# Patient Record
Sex: Male | Born: 1940 | Race: White | Hispanic: No
Health system: Southern US, Community
[De-identification: ages and names within clinical notes are randomized; demographics above are authoritative.]

---

## 2006-09-24 ENCOUNTER — Inpatient Hospital Stay: Payer: Self-pay | Admitting: Internal Medicine

## 2006-09-24 ENCOUNTER — Other Ambulatory Visit: Payer: Self-pay

## 2006-11-13 ENCOUNTER — Other Ambulatory Visit: Payer: Self-pay

## 2006-11-14 ENCOUNTER — Inpatient Hospital Stay: Payer: Self-pay | Admitting: Internal Medicine

## 2011-08-15 ENCOUNTER — Ambulatory Visit: Payer: Self-pay | Admitting: Oncology

## 2011-08-15 ENCOUNTER — Ambulatory Visit: Payer: Self-pay | Admitting: Internal Medicine

## 2011-09-11 ENCOUNTER — Inpatient Hospital Stay: Payer: Self-pay | Admitting: Internal Medicine

## 2011-09-11 LAB — CBC WITH DIFFERENTIAL/PLATELET
Basophil #: 0 10*3/uL (ref 0.0–0.1)
Basophil %: 0.4 %
Eosinophil #: 0 10*3/uL (ref 0.0–0.7)
HGB: 11.9 g/dL — ABNORMAL LOW (ref 13.0–18.0)
Lymphocyte %: 4.3 %
MCH: 30.7 pg (ref 26.0–34.0)
MCHC: 33.4 g/dL (ref 32.0–36.0)
MCV: 92 fL (ref 80–100)
Monocyte #: 0.5 10*3/uL (ref 0.0–0.7)
Monocyte %: 3.6 %
Neutrophil #: 12.3 10*3/uL — ABNORMAL HIGH (ref 1.4–6.5)
Platelet: 283 10*3/uL (ref 150–440)
RDW: 14.6 % — ABNORMAL HIGH (ref 11.5–14.5)
WBC: 13.4 10*3/uL — ABNORMAL HIGH (ref 3.8–10.6)

## 2011-09-11 LAB — COMPREHENSIVE METABOLIC PANEL
Albumin: 3.4 g/dL (ref 3.4–5.0)
Alkaline Phosphatase: 108 U/L (ref 50–136)
Anion Gap: 13 (ref 7–16)
BUN: 13 mg/dL (ref 7–18)
Calcium, Total: 8.8 mg/dL (ref 8.5–10.1)
Glucose: 107 mg/dL — ABNORMAL HIGH (ref 65–99)
Osmolality: 276 (ref 275–301)
SGOT(AST): 28 U/L (ref 15–37)
SGPT (ALT): 15 U/L
Total Protein: 7.4 g/dL (ref 6.4–8.2)

## 2011-09-11 LAB — CK TOTAL AND CKMB (NOT AT ARMC): CK-MB: 0.9 ng/mL (ref 0.5–3.6)

## 2011-09-12 LAB — IRON AND TIBC: Unbound Iron-Bind.Cap.: 102 ug/dL

## 2011-09-12 LAB — HEMOGLOBIN A1C: Hemoglobin A1C: 5.2 % (ref 4.2–6.3)

## 2011-09-12 LAB — URINALYSIS, COMPLETE
Bacteria: NONE SEEN
Bilirubin,UR: NEGATIVE
Blood: NEGATIVE
Glucose,UR: NEGATIVE mg/dL (ref 0–75)
Leukocyte Esterase: NEGATIVE
Nitrite: NEGATIVE
Ph: 5 (ref 4.5–8.0)
Specific Gravity: 1.058 (ref 1.003–1.030)
WBC UR: 2 /HPF (ref 0–5)

## 2011-09-12 LAB — CBC WITH DIFFERENTIAL/PLATELET
Basophil #: 0 10*3/uL (ref 0.0–0.1)
Basophil %: 0.3 %
Eosinophil #: 0 10*3/uL (ref 0.0–0.7)
HGB: 9.7 g/dL — ABNORMAL LOW (ref 13.0–18.0)
Lymphocyte %: 20 %
MCH: 30.6 pg (ref 26.0–34.0)
MCHC: 33.1 g/dL (ref 32.0–36.0)
MCV: 93 fL (ref 80–100)
Monocyte #: 0.5 10*3/uL (ref 0.0–0.7)
Neutrophil #: 5.7 10*3/uL (ref 1.4–6.5)
Neutrophil %: 73.6 %
Platelet: 197 10*3/uL (ref 150–440)
RDW: 14.6 % — ABNORMAL HIGH (ref 11.5–14.5)
WBC: 7.8 10*3/uL (ref 3.8–10.6)

## 2011-09-12 LAB — BASIC METABOLIC PANEL
Anion Gap: 12 (ref 7–16)
BUN: 14 mg/dL (ref 7–18)
Chloride: 98 mmol/L (ref 98–107)
Co2: 28 mmol/L (ref 21–32)
EGFR (African American): >60
Osmolality: 276 (ref 275–301)
Potassium: 3.8 mmol/L (ref 3.5–5.1)
Sodium: 138 mmol/L (ref 136–145)

## 2011-09-12 LAB — FERRITIN: Ferritin (ARMC): 309 ng/mL (ref 8–388)

## 2011-09-12 LAB — FOLATE: Folic Acid: 4.2 ng/mL (ref 3.1–100.0)

## 2011-09-13 LAB — CBC WITH DIFFERENTIAL/PLATELET
Basophil %: 0.2 %
Eosinophil #: 0.1 10*3/uL (ref 0.0–0.7)
Eosinophil %: 1.2 %
HCT: 27.6 % — ABNORMAL LOW (ref 40.0–52.0)
HGB: 9.3 g/dL — ABNORMAL LOW (ref 13.0–18.0)
Lymphocyte #: 1.5 10*3/uL (ref 1.0–3.6)
Lymphocyte %: 23 %
MCH: 31 pg (ref 26.0–34.0)
MCV: 93 fL (ref 80–100)
Monocyte #: 0.3 10*3/uL (ref 0.0–0.7)
Neutrophil #: 4.7 10*3/uL (ref 1.4–6.5)
Neutrophil %: 70.9 %
Platelet: 194 10*3/uL (ref 150–440)
RBC: 2.99 10*6/uL — ABNORMAL LOW (ref 4.40–5.90)
RDW: 14.7 % — ABNORMAL HIGH (ref 11.5–14.5)

## 2011-09-14 LAB — PROTIME-INR
INR: 1
Prothrombin Time: 13.5 secs (ref 11.5–14.7)

## 2011-09-14 LAB — MAGNESIUM: Magnesium: 1.6 mg/dL — ABNORMAL LOW

## 2011-09-14 LAB — APTT: Activated PTT: 35.8 secs (ref 23.6–35.9)

## 2011-09-14 LAB — PHOSPHORUS: Phosphorus: 2 mg/dL — ABNORMAL LOW (ref 2.5–4.9)

## 2011-09-15 ENCOUNTER — Ambulatory Visit: Payer: Self-pay | Admitting: Oncology

## 2011-09-15 ENCOUNTER — Ambulatory Visit: Payer: Self-pay | Admitting: Internal Medicine

## 2011-09-17 LAB — CBC WITH DIFFERENTIAL/PLATELET
Basophil %: 0.5 %
Eosinophil %: 2.6 %
HGB: 8.4 g/dL — ABNORMAL LOW (ref 13.0–18.0)
Lymphocyte #: 1.6 10*3/uL (ref 1.0–3.6)
Lymphocyte %: 26.8 %
MCH: 30.4 pg (ref 26.0–34.0)
Monocyte #: 0.3 10*3/uL (ref 0.0–0.7)
Monocyte %: 4.6 %
Neutrophil %: 65.5 %
Platelet: 174 10*3/uL (ref 150–440)
RBC: 2.78 10*6/uL — ABNORMAL LOW (ref 4.40–5.90)

## 2011-09-17 LAB — BASIC METABOLIC PANEL
Anion Gap: 9 (ref 7–16)
BUN: 5 mg/dL — ABNORMAL LOW (ref 7–18)
Chloride: 98 mmol/L (ref 98–107)
Co2: 33 mmol/L — ABNORMAL HIGH (ref 21–32)
Creatinine: 0.59 mg/dL — ABNORMAL LOW (ref 0.60–1.30)
EGFR (African American): >60
Potassium: 3.3 mmol/L — ABNORMAL LOW (ref 3.5–5.1)
Sodium: 140 mmol/L (ref 136–145)

## 2011-09-17 LAB — MAGNESIUM: Magnesium: 1.8 mg/dL

## 2011-09-17 LAB — PHOSPHORUS: Phosphorus: 2 mg/dL — ABNORMAL LOW (ref 2.5–4.9)

## 2011-09-18 LAB — BASIC METABOLIC PANEL
Chloride: 98 mmol/L (ref 98–107)
Co2: 34 mmol/L — ABNORMAL HIGH (ref 21–32)
Creatinine: 0.61 mg/dL (ref 0.60–1.30)
EGFR (Non-African Amer.): >60
Glucose: 110 mg/dL — ABNORMAL HIGH (ref 65–99)
Sodium: 135 mmol/L — ABNORMAL LOW (ref 136–145)

## 2011-09-20 LAB — PATHOLOGY REPORT

## 2011-09-29 LAB — COMPREHENSIVE METABOLIC PANEL
Alkaline Phosphatase: 120 U/L (ref 50–136)
Anion Gap: 9 (ref 7–16)
Calcium, Total: 8.2 mg/dL — ABNORMAL LOW (ref 8.5–10.1)
Co2: 33 mmol/L — ABNORMAL HIGH (ref 21–32)
EGFR (African American): >60
EGFR (Non-African Amer.): >60
Osmolality: 272 (ref 275–301)
Sodium: 134 mmol/L — ABNORMAL LOW (ref 136–145)
Total Protein: 7.2 g/dL (ref 6.4–8.2)

## 2011-09-29 LAB — CBC
HCT: 26.6 % — ABNORMAL LOW (ref 40.0–52.0)
HGB: 8.8 g/dL — ABNORMAL LOW (ref 13.0–18.0)
MCHC: 33.1 g/dL (ref 32.0–36.0)
MCV: 96 fL (ref 80–100)
Platelet: 394 10*3/uL (ref 150–440)
RBC: 2.77 10*6/uL — ABNORMAL LOW (ref 4.40–5.90)
RDW: 18.2 % — ABNORMAL HIGH (ref 11.5–14.5)
WBC: 8.9 10*3/uL (ref 3.8–10.6)

## 2011-09-29 LAB — PROTIME-INR: Prothrombin Time: 15 secs — ABNORMAL HIGH (ref 11.5–14.7)

## 2011-09-30 ENCOUNTER — Inpatient Hospital Stay: Payer: Self-pay | Admitting: Internal Medicine

## 2011-09-30 LAB — DIFFERENTIAL
Basophil %: 0.1 %
Eosinophil %: 0.4 %
Lymphocyte #: 1.2 10*3/uL (ref 1.0–3.6)
Monocyte #: 0.7 10*3/uL (ref 0.0–0.7)
Monocyte %: 8.1 %
Neutrophil %: 78.2 %

## 2011-09-30 LAB — CBC WITH DIFFERENTIAL/PLATELET
Basophil %: 0.3 %
Eosinophil %: 0.7 %
HGB: 8.7 g/dL — ABNORMAL LOW (ref 13.0–18.0)
Lymphocyte #: 0.7 10*3/uL — ABNORMAL LOW (ref 1.0–3.6)
MCH: 31.9 pg (ref 26.0–34.0)
MCV: 96 fL (ref 80–100)
Monocyte #: 0.6 10*3/uL (ref 0.0–0.7)
Monocyte %: 8.4 %
Neutrophil #: 5.5 10*3/uL (ref 1.4–6.5)
Neutrophil %: 80.7 %
RBC: 2.74 10*6/uL — ABNORMAL LOW (ref 4.40–5.90)
WBC: 6.9 10*3/uL (ref 3.8–10.6)

## 2011-10-01 LAB — CBC WITH DIFFERENTIAL/PLATELET
Eosinophil %: 2.3 %
Lymphocyte #: 0.7 10*3/uL — ABNORMAL LOW (ref 1.0–3.6)
MCH: 31.2 pg (ref 26.0–34.0)
MCV: 97 fL (ref 80–100)
Monocyte #: 0.5 10*3/uL (ref 0.0–0.7)
Platelet: 321 10*3/uL (ref 150–440)
WBC: 4.2 10*3/uL (ref 3.8–10.6)

## 2011-10-02 LAB — BASIC METABOLIC PANEL
Anion Gap: 7 (ref 7–16)
Calcium, Total: 8.4 mg/dL — ABNORMAL LOW (ref 8.5–10.1)
Creatinine: 0.59 mg/dL — ABNORMAL LOW (ref 0.60–1.30)
EGFR (African American): >60
EGFR (Non-African Amer.): >60
Glucose: 64 mg/dL — ABNORMAL LOW (ref 65–99)
Osmolality: 269 (ref 275–301)

## 2011-10-02 LAB — CBC WITH DIFFERENTIAL/PLATELET
Basophil #: 0 10*3/uL (ref 0.0–0.1)
Basophil %: 0.6 %
Eosinophil #: 0.1 10*3/uL (ref 0.0–0.7)
HGB: 11.7 g/dL — ABNORMAL LOW (ref 13.0–18.0)
Lymphocyte #: 1.1 10*3/uL (ref 1.0–3.6)
Lymphocyte %: 24.2 %
MCH: 30.2 pg (ref 26.0–34.0)
MCHC: 32.8 g/dL (ref 32.0–36.0)
MCV: 92 fL (ref 80–100)
Monocyte #: 0.6 10*3/uL (ref 0.0–0.7)
Neutrophil %: 60.8 %
Platelet: 342 10*3/uL (ref 150–440)

## 2011-10-02 LAB — MAGNESIUM: Magnesium: 1.9 mg/dL

## 2011-10-13 ENCOUNTER — Ambulatory Visit: Payer: Self-pay | Admitting: Oncology

## 2011-10-13 ENCOUNTER — Ambulatory Visit: Payer: Self-pay | Admitting: Internal Medicine

## 2011-10-13 DEATH — deceased

## 2013-08-24 IMAGING — CT CT ABD-PELV W/ CM
1 of 2 series · 16 of 32 positions shown, 20 images · non-contrast
Comparison: none

REASON FOR EXAM: (1) fell hit r side c.o pain upper abd; (2) fell pain
COMMENTS:

PROCEDURE:     CT  - CT ABDOMEN / PELVIS  W  - September 11, 2011  [DATE]
RESULT:     History: Fall.

[Series 2: 3mm soft tissue · axial · 0.59mm/px · z∈[-364,+24]mm · 16 of 141 slices shown, 20 images]
[im 6/141  soft-tissue]
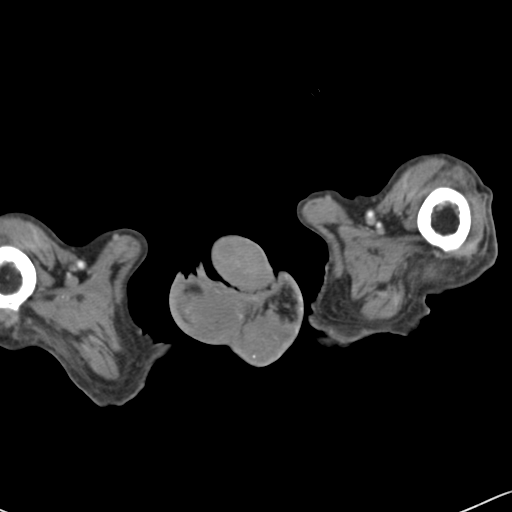
[im 6/141  bone]
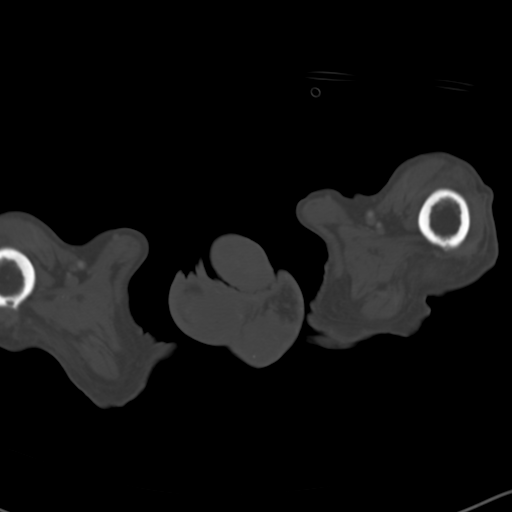
[im 18/141  soft-tissue]
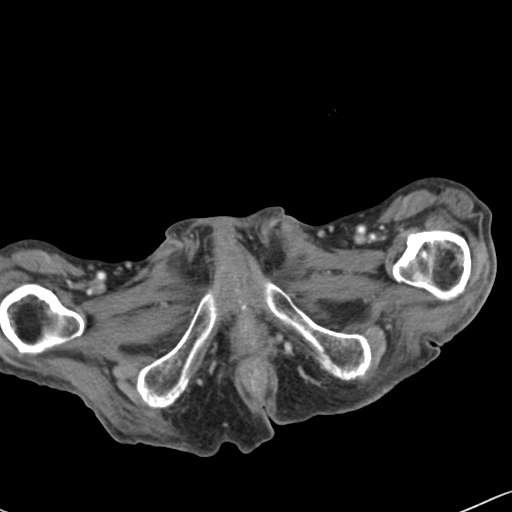
[im 30/141  soft-tissue]
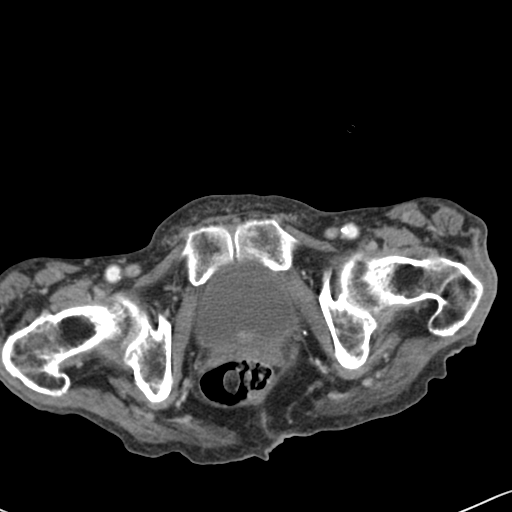
[im 36/141  soft-tissue]
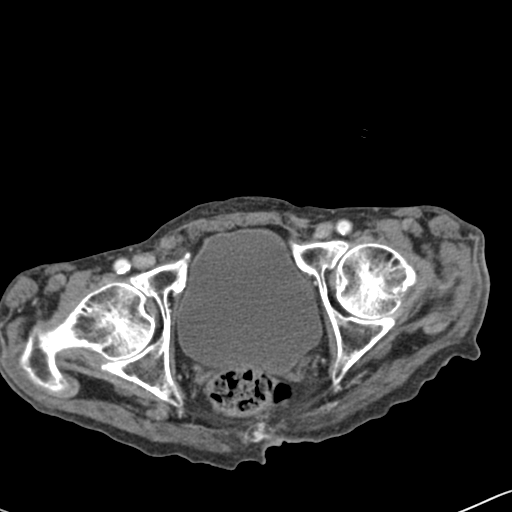
[im 47/141  soft-tissue]
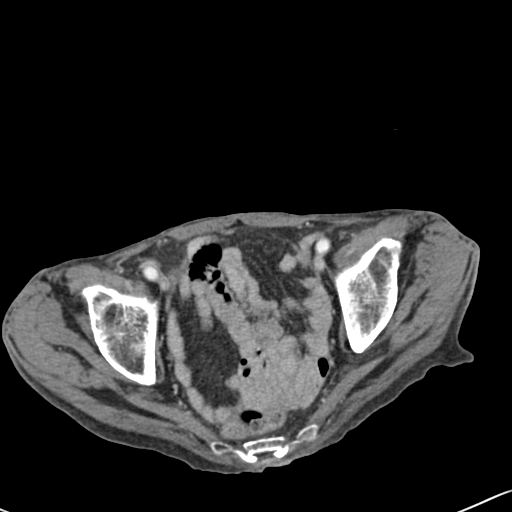
[im 59/141  soft-tissue]
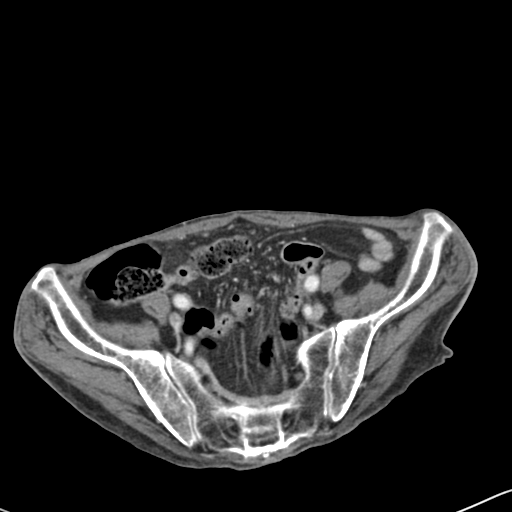
[im 65/141  soft-tissue]
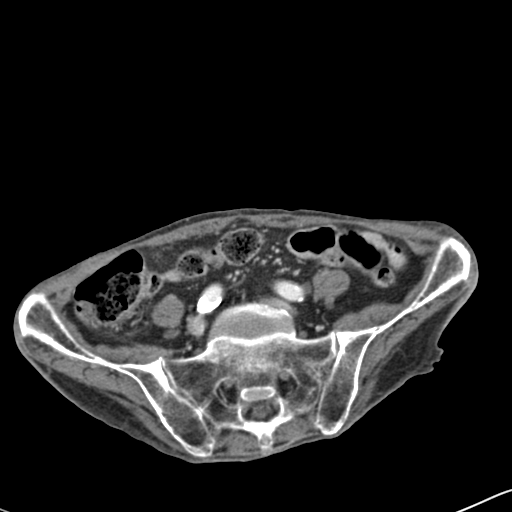
[im 76/141  soft-tissue]
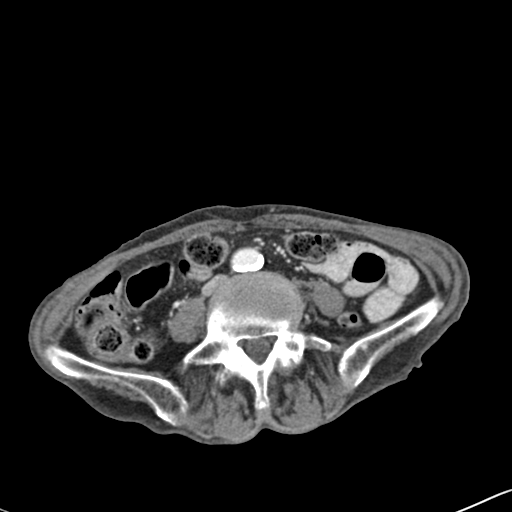
[im 82/141  soft-tissue]
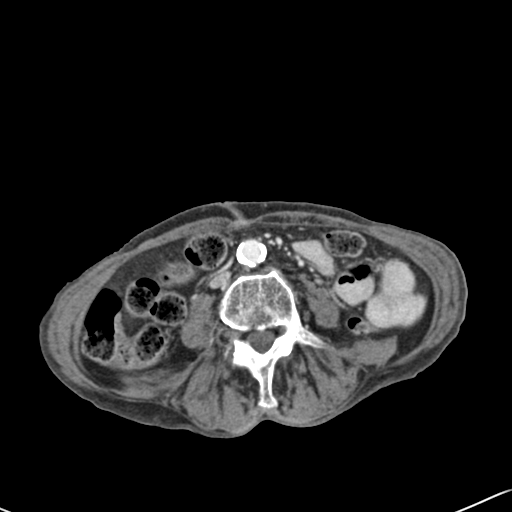
[im 82/141  bone]
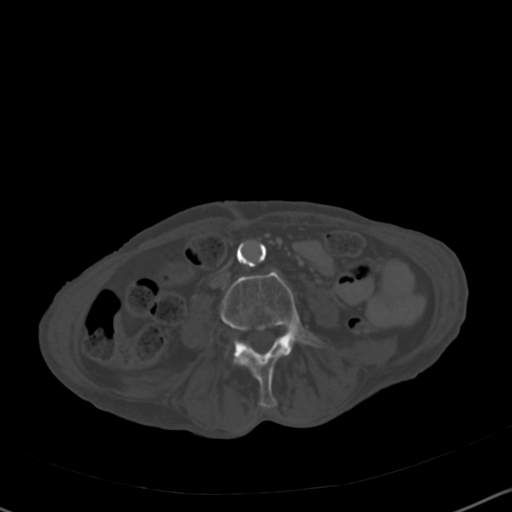
[im 94/141  soft-tissue]
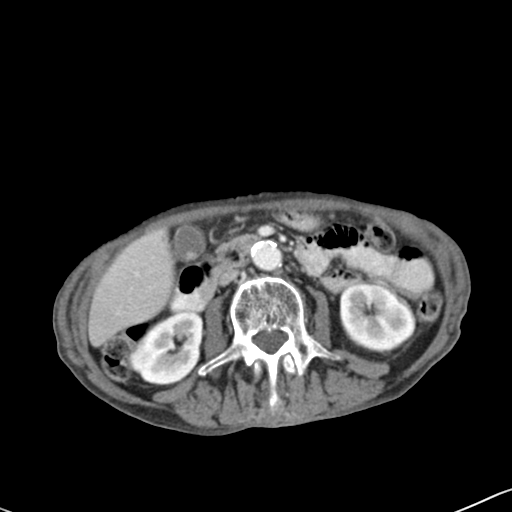
[im 106/141  soft-tissue]
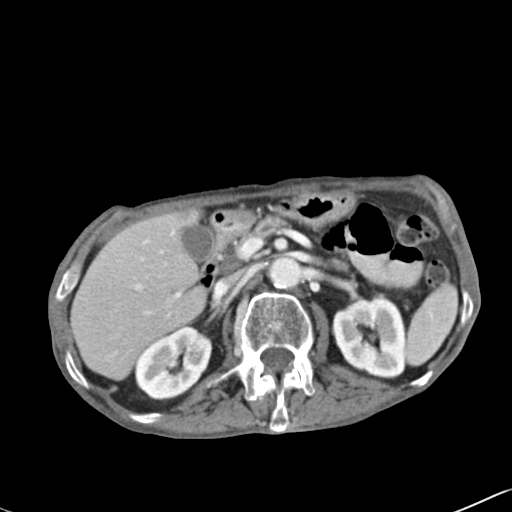
[im 111/141  soft-tissue]
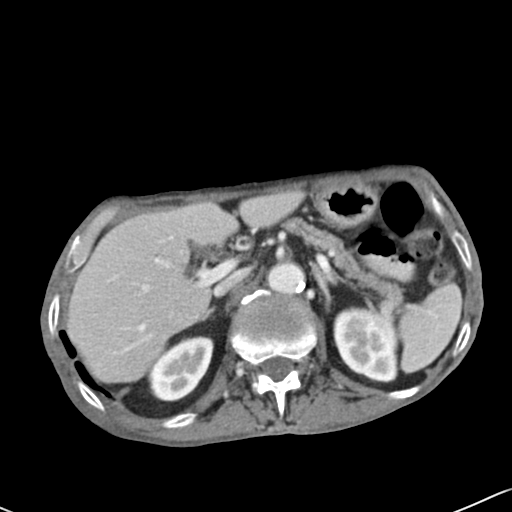
[im 117/141  lung]
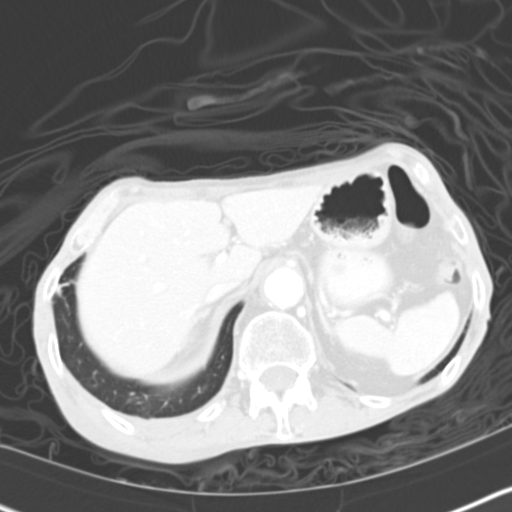
[im 123/141  soft-tissue]
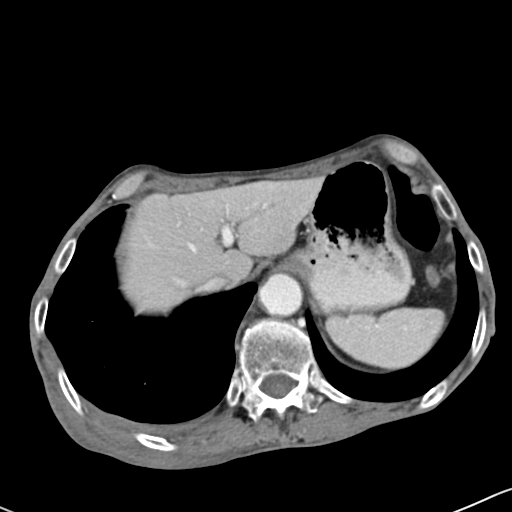
[im 123/141  lung]
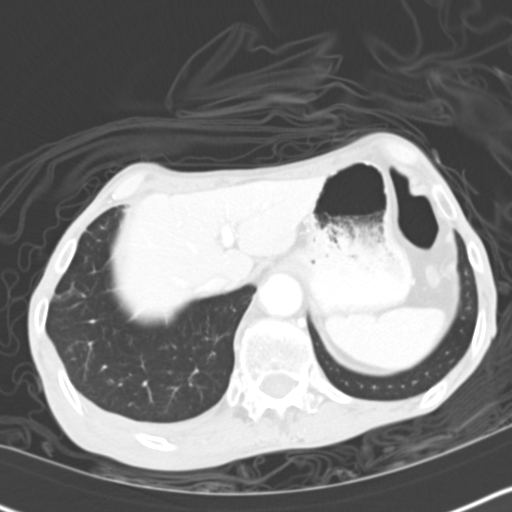
[im 129/141  lung]
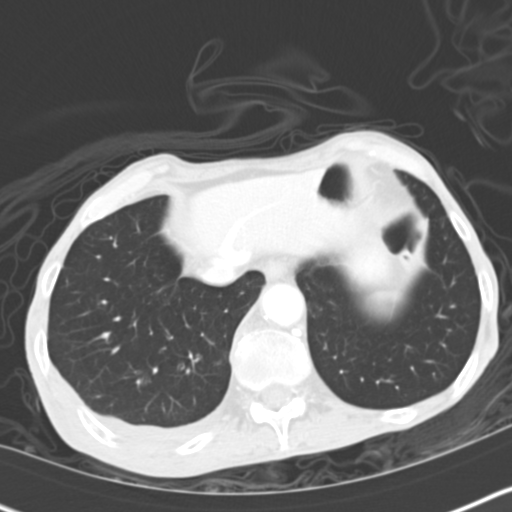
[im 135/141  soft-tissue]
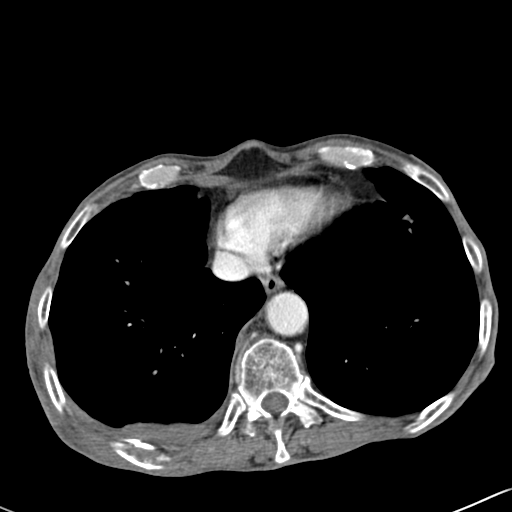
[im 135/141  lung]
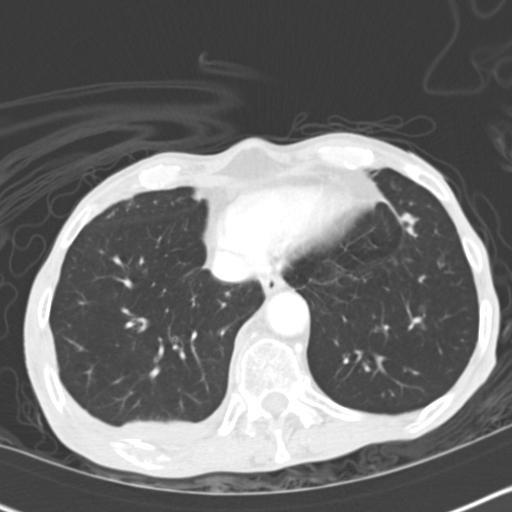

[16 of 32 positions shown; findings below may reference images not displayed]

FINDINGS: Mild base atelectasis noted. Nodular densities noted in the lung
bases. Metastatic disease cannot be excluded. There is a fracture of right
posterior ribs with adjacent pleural thickening and/or mild pleural
hemorrhage or effusion. Liver normal. Spleen normal. Pancreas normal.
Gallbladder nondistended. Adrenals normal. Kidneys normal. Aorta
nondistended. No bowel distention. No free air. Bladder nondistended.
Prostate calcifications and enlargement. No adenopathy.
IMPRESSION: 1. Right posterior rib fractures with adjacent pleural thickening as
described above.
2. Pulmonary nodules.

## 2013-08-24 IMAGING — CR DG FEMUR 2V*R*
1 series · 5 of 5 positions shown · non-contrast
Comparison: none

REASON FOR EXAM: pain following trauma
COMMENTS:

PROCEDURE:     DXR - DXR FEMUR RIGHT  - September 11, 2011  [DATE]
RESULT:     Comparison: None.

[Series 1: ap · 0.17mm/px · 5 of 5 slices shown]
[im 1/5]
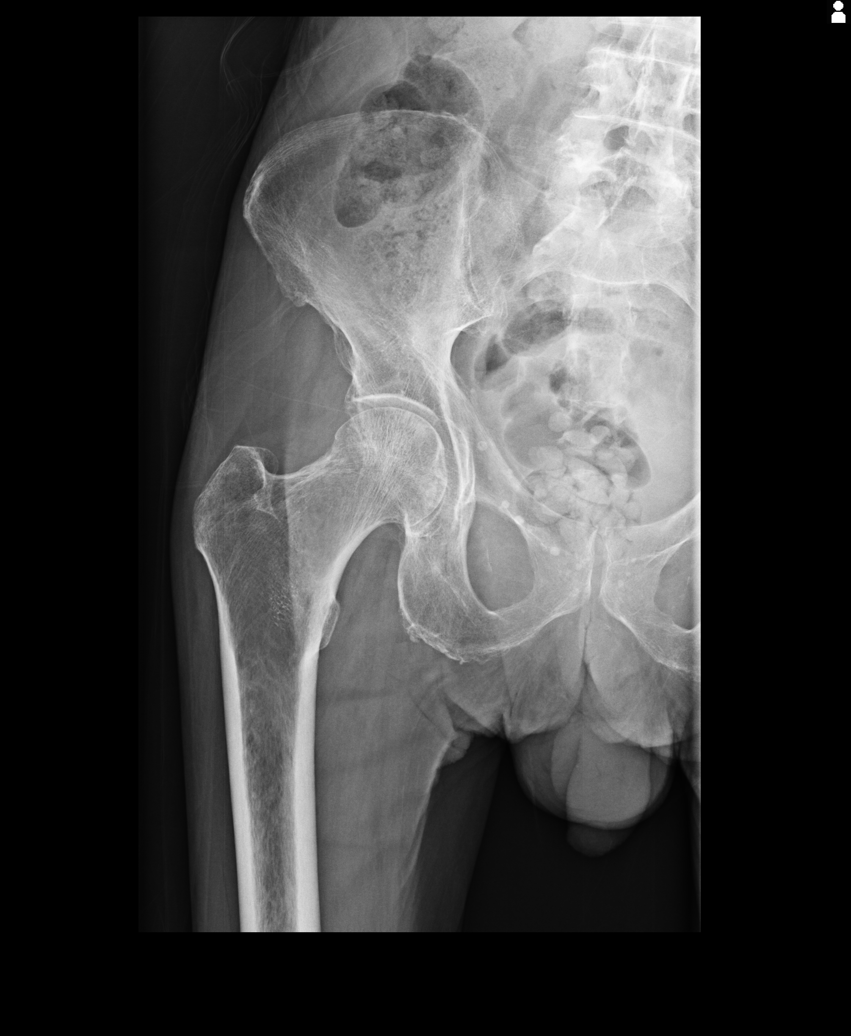
[im 2/5]
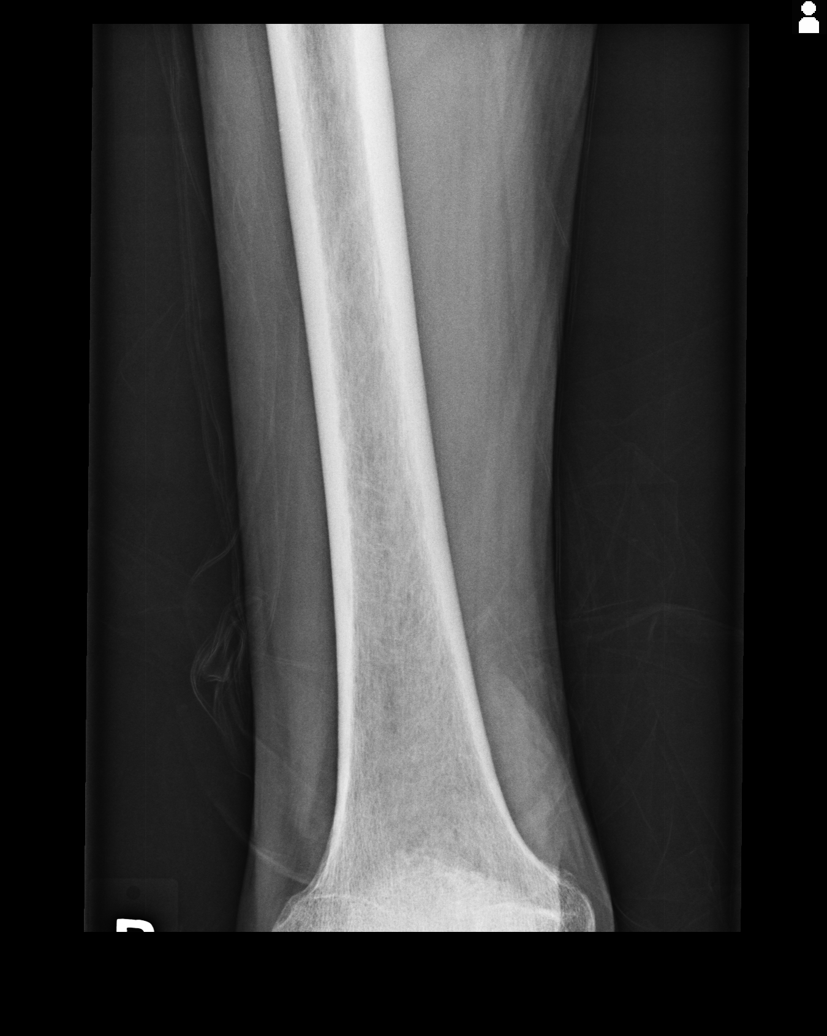
[im 3/5]
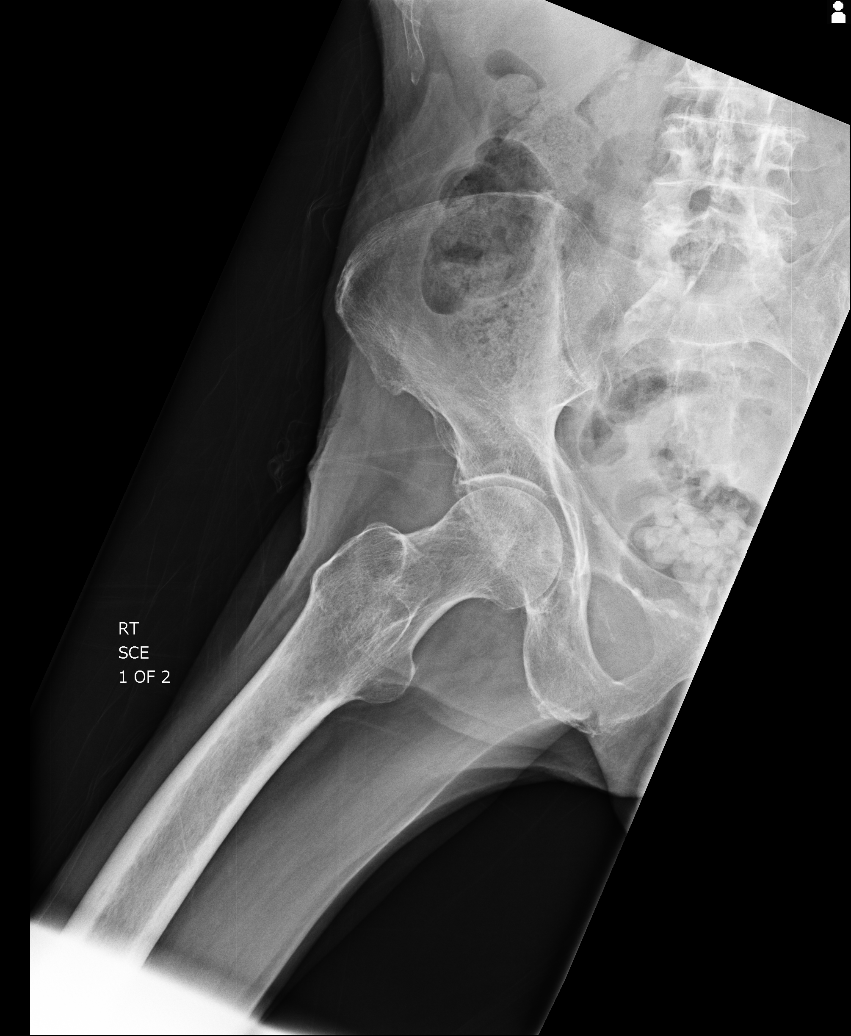
[im 4/5]
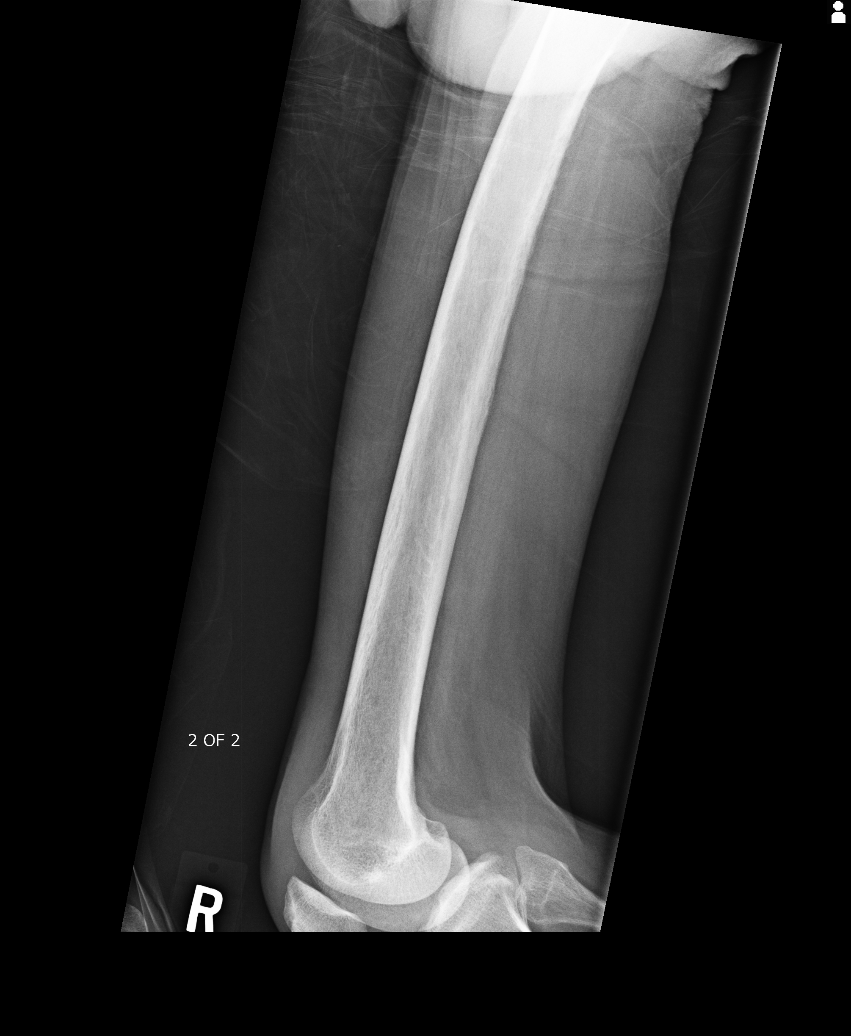
[im 5/5]
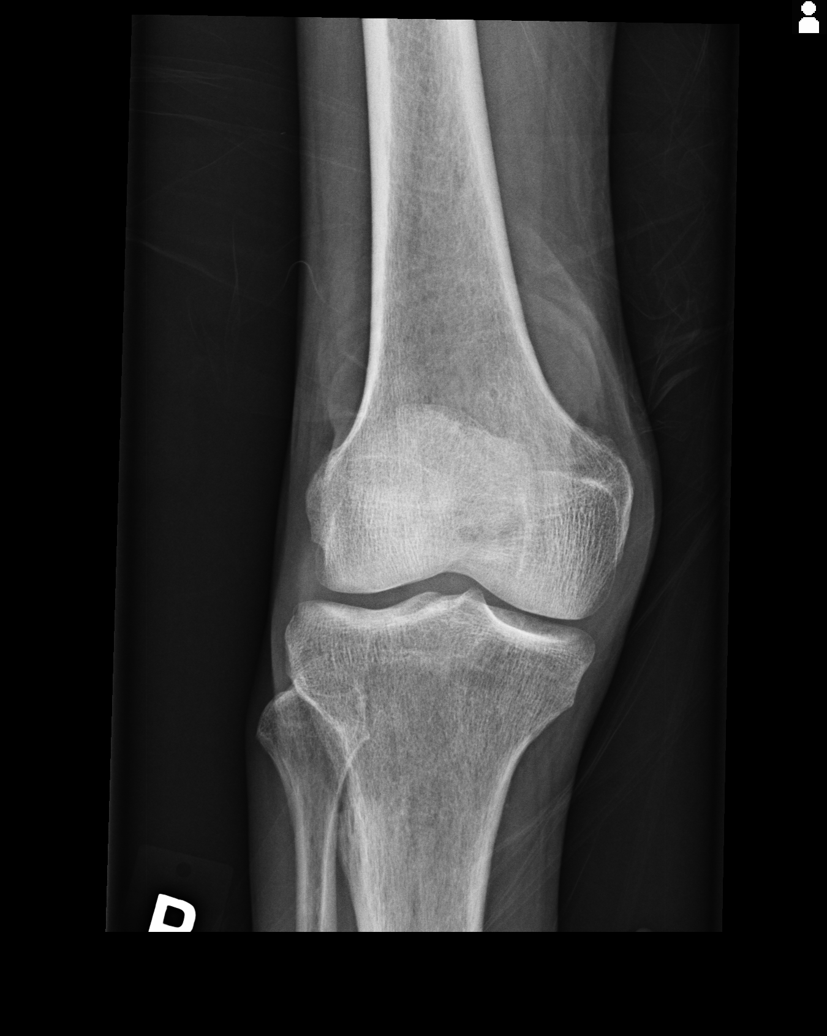

[5 of 5 positions shown; findings below may reference images not displayed]

FINDINGS: There is minimal osteophytosis of the femoral head. No acute fracture seen.
IMPRESSION: No acute fracture seen. Given the patient's age and relative bone density,
if there is continued clinical concern for occult hip fracture, further
evaluation with MRI is recommended.

## 2014-12-06 NOTE — Consult Note (Signed)
Brief Consult Note: Diagnosis: ? GI bleed.   Patient was seen by consultant.   Consult note dictated.   Orders entered.   Comments: ? Coffee ground discharge around gastrostomy tube. No evidence of GI bleed and no further discharge seen. Gastrostomy tube was flushed and appears to be in good position. Some abdominal tenderness but abdominal examination is otherwise unremarkable.  Recommendations: May change PPI to per g tube. Repeat H and H in am. Start tube feedings per dieatician.  Electronic Signatures: Lurline DelIftikhar, Rafik Koppel (MD)  (Signed [phone]-Feb-13 13:22)  Authored: Brief Consult Note   Last Updated: 16-Feb-13 13:22 by Lurline DelIftikhar, Caeleb Batalla (MD)

## 2014-12-06 NOTE — Op Note (Signed)
PATIENT NAME:  [name], [name] MR#:  [id number] DATE OF BIRTH:  12-13-1940  DATE OF PROCEDURE:  09/13/2011  PREOPERATIVE DIAGNOSIS: Left tongue lesion.  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   PROCEDURE PERFORMED:   1. Direct microlaryngoscopy.  2. Esophagoscopy.  3. Bronchoscopy. 4. Biopsy of tongue.   SURGEON: Vernie Murders, M.D.   ANESTHESIA: General.   COMPLICATIONS: None.   TOTAL ESTIMATED BLOOD LOSS: 100 mL.   DESCRIPTION OF PROCEDURE: The patient was brought to the operating suite. He was given general anesthesia by oral endotracheal intubation going through the right side of the mouth. A #6 oral endotracheal tube was placed and secured on the right side. A Dedo laryngoscope was used first to visualize the hypopharynx and larynx. The vocal cords appeared to be normal. There was plenty room above the endotracheal tube to pass a bronchoscope through the cords. The piriform sinuses were clear. The epiglottis and vallecula was clear. There was a large mass from the tongue base that came forward. It was ulcerative in the middle and had edges that were just hanging loosely. This involved the entire left lateral border of the tongue from the tip of the tongue all the way to the tongue base. The fullness of the tongue extended across midline to within 1.5 cm of the right lateral border of the tongue. The right tongue base was not involved. This was more anteriorly where the fullness spread across the midline. The floor of mouth was intact as the lesion stopped at the inferior border of the tongue and then extended across to the floor of mouth or gingiva. The entire lesion was about 3 to 4 cm in height and highly stretched the tongue and the entire length of the tongue, which was approximately 5 cm; once you pulled the tongue out, then it was closer to 8 cm.  The microscope was used for visualization of some of these areas in high-power magnification. Biopsies  were taken from the tongue using large cup biting forceps, and much of the granular tissue was trimmed and smoothed off. The bleeding was controlled with electrocautery at the tongue base.   A 10 x 14 mm esophagoscope was used to visualize the upper esophagus. There was good opening of the sphincter. The mucosa was very healthy at the sphincter; and extending down to the GE junction was very healthy and smooth, no sign of any redness or inflammation anywhere.   A 6 x 30 bronchoscope was used for visualization of the trachea. It was passed through the larynx above the oral endotracheal tube down to the cuff. The cuff was deflated, passed it beyond that down to the carina and mainstem bronchi. These were totally clear. The trachea was very healthy. I did not see any signs of lesions in the airway at all. There was no significant secretions in the airway either. I removed the scope, and the cuff was reinflated beneath the cords. The patient tolerated the procedure well. There were no operative complications. The specimen was sent for permanent section. The tip of the tongue wanted to ooze a little bit, so some 3-0 chromics were used to bring the edges around the ulceration together to try to tamponade some of the ooze. Several 3-0 chromics were placed. The patient was awakened and taken to the recovery room. There were no operative complications.   ____________________________ Cammy Copa, MD phj:cbb D: 09/14/2011 08:03:31 ET T: 09/14/2011 10:03:09 ET JOB#: [id number]  cc: Cammy Copa,  MD, <Dictator> Cammy CopaPAUL H Anali Cabanilla MD ELECTRONICALLY SIGNED 10/02/2011 8:07

## 2014-12-06 NOTE — Consult Note (Signed)
Brief Consult Note: Diagnosis: Dysphagia.   Patient was seen by consultant.   Consult note dictated.   Orders entered.   Comments: Patient with orophrayngeal mass, most likely malignant and severe dysphagia and odynophagia. Agrre with need of a feeding tube. Will wait for ENT evaluation today to see if a PEG will be possible or patient may need a surgical gastrostomy tube if the tumor prevents passage of scope into the stomach. Discussed with patient and his family. They are in full agreement. Will plan on performing EGD with PEG tomorrow if ossible. Thanks.  Electronic Signatures: Lurline DelIftikhar, Miriana Gaertner (MD)  (Signed 30-Jan-13 11:17)  Authored: Brief Consult Note   Last Updated: 30-Jan-13 11:17 by Lurline DelIftikhar, Amaru Burroughs (MD)

## 2014-12-06 NOTE — Discharge Summary (Signed)
PATIENT NAME:  [name], [name] MR#:  [id number] DATE OF BIRTH:  [birth year]  DATE OF ADMISSION:  09/30/2011 DATE OF DISCHARGE:  10/03/2011  DISPOSITION: To hospice Home.   DISCHARGE DIAGNOSES:  1. 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  2. Oxygen-dependent chronic obstructive pulmonary disease.  3. History of rib fractures.   CONSULTANTS: 1. Oncology. 2. Palliative Care.   HOSPITAL COURSE: The patient is a 74 year old male with history of chronic obstructive pulmonary disease, history of head and neck cancer with metastasis, chronic obstructive pulmonary disease, and gastroesophageal reflux disease admitted because of coffee-ground leakage from PEG tube. According to the daughter, the patient was noted to have coffee leakage from his PEG tube and was sent here. The patient was seen three weeks before and the patient had a PEG tube at that time. Please look at the History and Physical for full details. Hemoglobin was 8.8 on admission. The patient was started on IV fluids along with Protonix drip. For GI bleed, he received 2 units of packed RBCs. The coffee-ground bleeding resolved and his hemoglobin improved to 11.7. Because of PEG leakage, he had a Gastrografin study, as recommended by Gastroenterology, which showed the PEG tube was in place. We started the tube feedings yesterday, and we bumped it up to 25 mL today. The patient pulled out the PEG tube. The patient was also seen by Dr. Orlie DakinFinnegan, from oncology. The patient had a CT scan that was highly suspicious for metastasis to the lungs. Because of his poor performance status and inability to get adequate nutrition, chemotherapy and radiation are not options for him. The same was discussed with the family, especially the daughter, and Palliative Care, Dr. Harvie JuniorPhifer, has seen the patient and discussed the goals of treatment. Because of  decline in his functional status with severe cachexia, they decided to pursue Hospice Home. [facility]. He will be transferred to the Hospice Home today.    CODE STATUS: DO NOT RESUSCITATE.   TOTAL TIME SPENT ON DISCHARGE PREPARATION: More than 30 minutes.  ____________________________ Katha HammingSnehalatha Riker Collier, MD sk:slb D: 10/03/2011 15:53:03 ET T: 10/04/2011 09:39:12 ET JOB#: [id number]  cc: Katha HammingSnehalatha Tajanay Hurley, MD, <Dictator> Katha HammingSNEHALATHA Noya Santarelli MD ELECTRONICALLY SIGNED 10/07/2011 8:06

## 2014-12-06 NOTE — Consult Note (Signed)
Brief Consult Note: Diagnosis: tongue CA.   Patient was seen by consultant.   Discussed with Attending MD.   Comments: Posted PAC for Noonish in OR as add on case, but when I came to speak to the patient about it he refused. He said he would have to think about it, but he definitely did not want a PAC today.  Will leave tenatively on OR schedule in case he changes his mind.  Electronic Signatures: Claude MangesMarterre, Kennet Mccort F (MD)  (Signed 31-Jan-13 10:25)  Authored: Brief Consult Note   Last Updated: 31-Jan-13 10:25 by Claude MangesMarterre, Kedrick Mcnamee F (MD)

## 2014-12-06 NOTE — Discharge Summary (Signed)
PATIENT NAME:  [name], [name] MR#:  [id number] DATE OF BIRTH:  1940-12-06  DATE OF ADMISSION:  09/11/2011 DATE OF DISCHARGE:  09/18/2011   ADMITTING PHYSICIAN: Dr. Dava NajjarPanwar DISCHARGING PHYSICIAN: Dr. Enid Baasadhika Joleah Kosak   PRIMARY CARE PHYSICIAN: None   CONSULTANTS:  1. Oncology consultation by Dr. Gerarda Fractionimothy Finnegan.  2. GI consultation by Dr. Lurline DelShaukat Iftikhar.  3. Palliative care consultation by Dr. Harriett SineNancy Phifer.  4. Psychiatric consultation by Dr. Toni Amendlapacs to evaluate capacity to make informed medical decisions.  5. ENT consultation by Dr. Vernie MurdersPaul Juengel. 6. Surgical consultation by Dr. Michela PitcherEly.    DISCHARGE DIAGNOSES:  1. Stage IV head and neck cancer.  2. 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  3. Failure to thrive.  4. Severe malnutrition.  5. Tobacco use disorder.  6. Dysphagia/odynophagia secondary to tongue cancer.  7. Right posterior rib fractures with pain.  8. Anemia of chronic disease.   DISCHARGE MEDICATIONS:  1. Percocet 5/325, 1 to 2 tablets p.o. q. 4 hours p.r.n.  2. Fentanyl 50-mcg patch transdermal, apply q. 3 days.   DISCHARGE DIET:  1. The patient is on tube feeds of Jevity 1.5 calories 237 mL can- one can at 8:00 a.m., one at 11:00 a.m., one at 2:00 p.m., one at 5:00 p.m. and the other at 8:00 p.m. 50-mL water flushes before and after each bolus feed.   2. Check residuals prior to each feeding. If residual is greater than 100 mL,  hold feeding for two hours and recheck. If residual is less than 100 mL, start feeding.  3. Keep head of bed elevated greater than 30 degrees during and at least one hour following feeding to prevent aspiration.  ACTIVITY LIMITATIONS: As tolerated.   FOLLOWUP INSTRUCTIONS:  1. Follow up with Dr. Orlie DakinFinnegan in 1 to 2 weeks.  2. Follow up with radiation oncologist Dr. Rushie Chestnuthrystal in two weeks.  3. Physical therapy.  LABORATORY, DIAGNOSTIC, AND RADIOLOGICAL DATA: WBC 6, hemoglobin 8.4,  hematocrit 25.7, platelet count 174.   Sodium 135, potassium 3.8, chloride 98, bicarbonate 34, BUN 4, creatinine 0.61, glucose 110, calcium 7.9. Magnesium 1.8, phosphorus 2 that was replaced. INR 1. Prealbumin less than 4. Chest x-ray showing small nodular densities left lower lung similar to recent examination. Atypical infection versus malignancy consideration.    Pathology report from tongue and oropharyngeal mass biopsy showing invasive squamous cell carcinoma, moderately differentiated with focal keratinization.   Vitamin B12 level is 218. Serum iron is 57. Iron-binding capacity at 159. Ferritin 309. Folic acid 4.2. Urinalysis negative for any infection.   ALT 15, AST 28, alkaline phosphatase 108, total bilirubin 0.9, albumin 3.4.  CT of the abdomen and pelvis with contrast showing right posterior rib fractures with adjacent pleural thickening as described below. Nodular densities noted in lung bases. Metastatic disease cannot be excluded. Liver, spleen, pancreas, gallbladder, adrenal, and kidneys are normal. Aorta is nondistended. No wall abnormality. CT of the head without contrast showing no acute abnormality, no hydrocephalus, hemorrhage, or mass present. Bilateral basal ganglia calcification and chronic ischemic changes present. CT of the C-spine showing degenerative changes, no fractures. Tongue and oropharyngeal soft tissue mass present. HbA1c 5.2.   PET scan showing oropharyngeal malignant uptake with probable metastatic disease in the left posterior cervical lymph node, probably muscular activity over the neck on right anteriorly laterally. No abnormal localization evident within the lungs. Small right pleural effusion and ground-glass attenuation present in the lungs.   BRIEF HOSPITAL COURSE: Mr. [name] is  a 74 year old male with history of chronic obstructive pulmonary disease, hiatal hernia, and gastroesophageal reflux disease who was brought in from home secondary to multiple falls  at home. The patient was noted to have an enlarged tongue and was unable to speak. According to the patient's son, who was contacted at the time of admission, the patient's tongue swelling has been going on for over two months. He has not been eating much loss and lost about 40 pounds in the past year. The patient also is a heavy drinker and also smokes about half a pack every day.  1. 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 But because of the patient's extreme malnutrition he is not a candidate for chemotherapy yet.  He was seen by Dr. Orlie Dakin in the hospital who recommended that the patient's PEG feedings be continued for at least two weeks and then re-evaluate in the office. Plan is to do radiation therapy followed by chemotherapy. The patient has a Port-A-Cath placed already.  2. Failure to thrive and severe malnutrition. Unable to eat anything p.o. because of tongue mass. Also will get radiation in the near future so he already has a PEG tube in place and is getting bolus tube feeds. 3. Fall with right posterior rib fracture with pain. He is on Percocet p.r.n. and also fentanyl patch.  4. Anemia of chronic disease. His iron levels are stable. His hemoglobin is stable. Continue to monitor for now.  5. CODE STATUS: FULL CODE. 6. His course has been otherwise uneventful in the hospital. There has been a question of the patient  returning home with his son versus requiting placement initially.  According to prior physicians who have rounded on him, the patient's son wanted to get him home, but according to patient's sister who has been to the hospital and appeared interested in the patient's well being, she wanted the patient to be at rehab and has expressed concern about the patient returning back home.  Dr. Toni Amend from psychiatry evaluated the patient and deemed him competent to make his own informed decisions.  The  patient wanted his sister to help him at this point though there is no legal documentation. Advice has been given to the patient's sister to talk to an attorney and  talk to the patient's son about getting a legal document or health care power of attorney. However, because of his PEG tube feedings now the patient will need a higher level of care so he will be going to a rehab skilled facility.  DISPOSITION:  Rehab facility.  DISCHARGE CONDITION:  Guarded at this time.   TIME SPENT ON DISCHARGE: 45 minutes.    ____________________________ Enid Baas, MD rk:bjt D: 09/18/2011 14:13:17 ET T: 09/18/2011 14:50:35 ET JOB#: [id number]  cc: Enid Baas, MD, <Dictator> Enid Baas MD ELECTRONICALLY SIGNED 09/19/2011 13:39

## 2014-12-06 NOTE — Consult Note (Signed)
Brief Consult Note: Diagnosis: Bleeding from Gtube site.   Patient was seen by consultant.   Recommend further assessment or treatment.   Discussed with Attending MD.   Comments: No active bleeding at this time. Discussed with ER MD. No surgical needs.  Electronic Signatures: Natale LayBird, Lori Liew (MD)  (Signed 15-Feb-13 21:31)  Authored: Brief Consult Note   Last Updated: 15-Feb-13 21:31 by Natale LayBird, Tremel Setters (MD)

## 2014-12-06 NOTE — Consult Note (Signed)
History of Present Illness:   Reason for Consult Head and neck cancer.    HPI   Patient recently diagnosed with head and neck cancer.  He had a feeding tube placed several weeks ago, but per his daughter has been refusing to allow the facility to give him his tube feeds.  He has been readmitted to the hospital for pain and possible leakage at the site of his tube feed.  The entire history is given by the daughter and sister.  He also complains of right lower leg pain, but without swelling or erythema.  As far as his daughter and sister are aware, he has offered no further complaints.  PFSH:   Additional Past Medical and Surgical History Past medical history: COPD, GERD.  Past surgical history: Appendectomy, tonsillectomy.  Next  Family history: Noncontributory.  Social history: Heavy tobacco use, previously 3 packs per day, now down to one half pack per day.  Previous heavy alcohol, currently only "12 beers per month".   Review of Systems:   Performance Status (ECOG) 2    Review of Systems   As per HPI. Otherwise, 10 point system review was negative.   NURSING NOTES: **Vital Signs.:   18-Feb-13 11:29    Vital Signs Type: Upon Transfer    Temperature Temperature (F): 97.4    Celsius: 36.3    Temperature Source: oral    Pulse Pulse: 77    Respirations Respirations: 20    Systolic BP Systolic BP: 242    Diastolic BP (mmHg) Diastolic BP (mmHg): 69    Mean BP: 94    BP Source: Dinamap    Pulse Ox % Pulse Ox %: 98    Pulse Ox Activity Level: At rest    Oxygen Delivery: 2L; Nasal Cannula   Physical Exam:   Physical Exam General: No acute distress. Eyes: Pink conjunctiva, anicteric sclera. Neck: Palpable lymphadenopathy. Lungs: Clear to auscultation bilaterally. Heart: Regular rate and rhythm. No rubs, murmurs, or gallops. Abdomen: Soft, normoactive bowel sounds. Musculoskeletal: No edema, cyanosis, or clubbing. Neuro: Alert, answering all questions  appropriately. Cranial nerves grossly intact. Skin: No rashes or petechiae noted. Psych: Normal affect.    No Known Allergies:     fentanyl 50 mcg/hr transdermal film, extended release: 1 patch transdermal every 72 hours, Active, 0, None   Percocet 5/325 oral tablet: 1 tab(s) orally every 4 hours, Active, 0, None  Routine Hem:  18-Feb-13 05:14    WBC (CBC) 4.7   RBC (CBC) 3.88   Hemoglobin (CBC) 11.7   Hematocrit (CBC) 35.8   Platelet Count (CBC) 342   MCV 92   MCH 30.2   MCHC 32.8   RDW 20.5  Routine Chem:  18-Feb-13 05:14    Glucose, Serum 64   BUN 5   Creatinine (comp) 0.59   Sodium, Serum 137   Potassium, Serum 3.1   Chloride, Serum 100   CO2, Serum 30   Calcium (Total), Serum 8.4   Osmolality (calc) 269   eGFR (African American) >60   eGFR (Non-African American) >60   Anion Gap 7  Routine Hem:  18-Feb-13 05:14    Neutrophil % 60.8   Lymphocyte % 24.2   Monocyte % 11.9   Eosinophil % 2.5   Basophil % 0.6   Neutrophil # 2.9   Lymphocyte # 1.1   Monocyte # 0.6   Eosinophil # 0.1   Basophil # 0.0  Routine Chem:  18-Feb-13 05:14    Magnesium, Serum 1.9  Assessment and Plan:  Impression:   Stage IV head and neck cancer.  Plan:   1.  Head and neck cancer: Pulmonary nodules previously seen on CT scan are highly suspicious for metastatic disease.  They are not PET positive, but this is likely because there size is under 1 cm.  Given his poor performance status and inability to get adequate nutrition, chemotherapy and radiation therapy are not an option at this point.  If patient's PEG tube is corrected and he is able to tolerate tube feeds, we can readdress treatment in the future. Leg pain: Will get ultrasound Doppler to assess for DVT. consult, will follow.  Electronic Signatures: Delight Hoh (MD)  (Signed [phone] 12:49)  Authored: HISTORY OF PRESENT ILLNESS, PFSH, ROS, NURSING NOTES, PE, ALLERGIES, HOME MEDICATIONS, LABS, ASSESSMENT AND  PLAN   Last Updated: 18-Feb-13 12:49 by Delight Hoh (MD)

## 2014-12-06 NOTE — Consult Note (Signed)
Chief Complaint:   Subjective/Chief Complaint Gastrograffin showed the feeding tube to be in place. Discussed with nurse. Will restart feedings at 15 cc per hour and gradually increase as tolerated. Tube was tightened earlier by Dr. Michela PitcherEly for better seal. No signs of active GI bleed. Will sign off. Thanks.   Electronic Signatures: Lurline DelIftikhar, Della Homan (MD)  (Signed [phone]-Feb-13 18:38)  Authored: Chief Complaint   Last Updated: 18-Feb-13 18:38 by Lurline DelIftikhar, Mariellen Blaney (MD)

## 2014-12-06 NOTE — Consult Note (Signed)
Chief Complaint:   Subjective/Chief Complaint Discussed with Dr. Michela PitcherEly. I will obtain a gastrograffin study in am to confirm placement. Further recommendations to follow.   Electronic Signatures: Lurline DelIftikhar, Lakelyn Straus (MD)  (Signed 17-Feb-13 14:14)  Authored: Chief Complaint   Last Updated: 17-Feb-13 14:14 by Lurline DelIftikhar, Zanyia Silbaugh (MD)

## 2014-12-06 NOTE — Op Note (Signed)
PATIENT NAME:  [name], [name] MR#:  [id number] DATE OF BIRTH:  March 01, 1941  DATE OF PROCEDURE:  09/15/2011  PREOPERATIVE DIAGNOSIS: Head and neck carcinoma.   POSTOPERATIVE DIAGNOSIS: Head and neck carcinoma.   PROCEDURES:  1. Moss gastrostomy tube.  2. Port-A-Cath placement.   SURGEON: Quentin Orealph L. Ely III, MD  ANESTHESIA: General.   DESCRIPTION OF PROCEDURE: With the patient in supine position after induction of appropriate general anesthesia, patient's abdomen was prepped with ChloraPrep and draped with sterile towels. An upper midline incision was made, carried down through the subcutaneous tissue bovied with electrocautery. Midline fascia identified and opened the length of the skin incision as was the peritoneum. Stomach was easily elevated into the wound. The pylorus was identified. On the greater curvature two pursestring sutures of 3-0 silk were placed. Bowel was opened. A separate stab was created on the anterior abdominal wall and small stab wound for the gastrostomy created. A Moss gastrostomy tube was inserted through the abdominal wall into the gastrostomy and then directed into the duodenum without difficulty. The pursestrings were secured and then sutured to the anterior abdominal wall without difficulty. The tube was then secured with 3-0 nylon. Midline fascia was closed with figure-of-eight suture of 0 Maxon. Skin was clipped. The area infiltrated with 0.25% Marcaine for postoperative pain control. Sterile dressings were applied. The patient was then reprepped and draped, repositioned. Right internal jugular vein was identified presurgery with the ultrasound. This area was prepped and draped with sterile towels and ChloraPrep. A small incision was made over the identified vein. Under ultrasound guidance the vein was cannulated and a wire passed under fluoroscopic control. The wire was then covered with a dilator and introducer and dilator wire removed. Epidural catheter was inserted the  introducer into the great vessel system and positioned using Conray. It was then tunneled to the second incision where it was attached to a heparin filled Port-A-Cath device. Using Conray again kinks and leaks were not identified and the catheter appeared to be in satisfactory position with good flow into the great vessel system. It was then flushed with heparinized saline, secured in place with 3-0 silk. Subcutaneous space obliterated with 3-0 Vicryl. Skin was closed with 4-0 nylon. Sterile dressings were applied. Patient returned to the recovery room having tolerated procedure well. Sponge, instrument, and needle counts were correct x2 in the Operating Room.   ____________________________ Quentin Orealph L. Ely III, MD rle:cms D: 09/15/2011 14:50:18 ET T: 09/15/2011 15:12:15 ET JOB#: [id number]  cc: Quentin Orealph L. Ely III, MD, <Dictator> Quentin OreALPH L ELY MD ELECTRONICALLY SIGNED 09/24/2011 21:14

## 2014-12-06 NOTE — Consult Note (Signed)
Chief Complaint:   Subjective/Chief Complaint Patient has not made a decision about PEG placement. Discussed with him and family. Will cancel plans for PEG today and wait for his decision.   Electronic Signatures: Lurline DelIftikhar, Gearldine Looney (MD)  (Signed 31-Jan-13 12:46)  Authored: Chief Complaint   Last Updated: 31-Jan-13 12:46 by Lurline DelIftikhar, Alexiz Cothran (MD)

## 2014-12-06 NOTE — Consult Note (Signed)
GI will sign off, reconsult if needed.  Electronic Signatures: Scot JunElliott, Robert T (MD)  (Signed on 02-Feb-13 13:03)  Authored  Last Updated: 02-Feb-13 13:03 by Scot JunElliott, Robert T (MD)

## 2014-12-06 NOTE — Consult Note (Signed)
History of Present Illness:   Reason for Consult Base of tongue mass, highly suspicious for malignancy.    HPI   Patient is a 74 year old male who was brought to the emergency room by her son after several falls earlier in the day.  Upon exam patient was unable to speak secondary to a "swollen tongue".  Patient's son reported he has been unable to speak for nearly 2 months.  He also reported nearly 40 pound unintentional weight loss in the past year.  Patient reports significant mouth pain.  He has difficulty eating because of the mass.  He denies any other pain.  He has no neurologic complaints.  He denies any chest pain or shortness of breath.  He denies any nausea, vomiting, constipation, or diarrhea.  Patient feels generally terrible, but offers no further specific complaints.  PFSH:   Additional Past Medical and Surgical History Past medical history: COPD, GERD.  Past surgical history: Appendectomy, tonsillectomy.  Next  Family history: Noncontributory.  Social history: Heavy tobacco use, previously 3 packs per day, now down to one half pack per day.  Previous heavy alcohol, currently only "12 beers per month.   Review of Systems:   Performance Status (ECOG) 1    Review of Systems   As per HPI. Otherwise, 10 point system review was negative.   NURSING NOTES: **Vital Signs.:   29-Jan-13 15:36    Vital Signs Type: Routine    Temperature Temperature (F): 97.2    Celsius: 36.2    Temperature Source: oral    Pulse Pulse: 83    Pulse source: per Dinamap    Respirations Respirations: 16    Systolic BP Systolic BP: 335    Diastolic BP (mmHg) Diastolic BP (mmHg): 67    Mean BP: 83    BP Source: Dinamap    Pulse Ox % Pulse Ox %: 100    Pulse Ox Activity Level: At rest    Oxygen Delivery: 2L   Physical Exam:   Physical Exam General: In, no acute distress. Eyes: Pink conjunctiva, anicteric sclera. Neck: Palpable lymphadenopathy. Lungs: Clear to auscultation  bilaterally. Heart: Regular rate and rhythm. No rubs, murmurs, or gallops. Abdomen: Soft, nontender, nondistended. No organomegaly noted, normoactive bowel sounds. Musculoskeletal: No edema, cyanosis, or clubbing. Neuro: Alert, answering all questions appropriately. Cranial nerves grossly intact. Skin: No rashes or petechiae noted. Psych: Normal affect.    No Known Allergies:   Routine Hem:  29-Jan-13 03:16    WBC (CBC) 7.8   RBC (CBC) 3.16   Hemoglobin (CBC) 9.7   Hematocrit (CBC) 29.2   Platelet Count (CBC) 197   MCV 93   MCH 30.6   MCHC 33.1   RDW 14.6   Neutrophil % 73.6   Lymphocyte % 20.0   Monocyte % 6.1   Eosinophil % 0.0   Basophil % 0.3   Neutrophil # 5.7   Lymphocyte # 1.6   Monocyte # 0.5   Eosinophil # 0.0   Basophil # 0.0  Routine Chem:  29-Jan-13 03:16    Glucose, Serum 98   BUN 14   Creatinine (comp) 0.77   Sodium, Serum 138   Potassium, Serum 3.8   Chloride, Serum 98   CO2, Serum 28   Calcium (Total), Serum 8.5   Osmolality (calc) 276   eGFR (African American) >60   eGFR (Non-African American) >60   Anion Gap 12   Magnesium, Serum 1.8   Hemoglobin A1c ([facility]) 5.2   Assessment and Plan:  Impression:   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  Plan:   1.  Neck cancer: Pulmonary nodules seen on CT scan are high this is for metastatic disease.  PET scan has been performed, but results are pending at time of dictation.  Patient has been evaluated by ENT and will likely have biopsy tomorrow to confirm malignancy.  Prior to initiating any systemic therapy, patient will likely need a feeding tube as well as port placement.  He is not a surgical candidate, but may benefit from chemotherapy and radiation therapy. Pain: Patient currently receiving IV morphine, but may benefit from longer acting fentanyl patch upon discharge given his difficulties with PO intake. consult, will follow.  Electronic Signatures: Delight Hoh (MD)  (Signed 29-Jan-13  16:15)  Authored: HISTORY OF PRESENT ILLNESS, PFSH, ROS, NURSING NOTES, PE, ALLERGIES, LABS, ASSESSMENT AND PLAN   Last Updated: 29-Jan-13 16:15 by Delight Hoh (MD)

## 2014-12-06 NOTE — Consult Note (Signed)
Chief Complaint:   Subjective/Chief Complaint Case discussed with Dr. Elliott who will place PEG in coordiMechele Collinnation with surgery. Appreciate help of Dr. Mechele CollinElliott. Will sign off. Thanks.   Electronic Signatures: Lurline DelIftikhar, Naleyah Ohlinger (MD)  (Signed 01-Feb-13 08:22)  Authored: Chief Complaint   Last Updated: 01-Feb-13 08:22 by Lurline DelIftikhar, Jarold Macomber (MD)

## 2014-12-06 NOTE — H&P (Signed)
PATIENT NAME:  Brad BrokerFLORENCE, [name] MR#:  [id number] DATE OF BIRTH:  11/17/1940  DATE OF ADMISSION:  09/11/2011  REFERRING PHYSICIAN: ER physician, Dr. Darnelle CatalanMalinda   PRIMARY CARE PHYSICIAN: None.   CHIEF COMPLAINT: Multiple falls, tongue swelling.   HISTORY OF PRESENT ILLNESS: The patient is a 74 year old male with history of chronic obstructive pulmonary disease, hiatal hernia, and gastroesophageal reflux disease who was brought in by his son after he had multiple falls today. The patient has an enlarged tongue and is unable to speak; therefore, the patient's son, Mr. [name], was called at [phone]. Majority of the information was obtained from him. As per the patient's son who has been living with him since September of 2012, the patient fell three times today. The last time was when he went to the bathroom and during the fall the patient hit the right side of his chest on the bathtub, therefore, the patient's son brought him to the hospital. As per the son, the patient has been unable to speak for the past two months because of tongue swelling. The patient's son thought that he had some oral infection. As per the patient's son, the patient does not appear to be having any difficulty swallowing. He has lost about 40 pounds in the past year. The patient was a three pack smoker in the past but is currently smoking only half pack per day. He was also a heavy drinker and used to drink hard liquor and beer. Currently he drinks a 12 pack over a month. As mentioned earlier, the patient is unable to communicate verbally.   PAST MEDICAL HISTORY:  1. Chronic obstructive pulmonary disease. 2. Hiatal hernia. 3. Gastroesophageal reflux disease. 4. Dilated loops of jejunum and abdominal pain.   PAST SURGICAL HISTORY:  1. Appendectomy.  2. Tonsillectomy.  3. Colonoscopy which had shown some polyps.   ALLERGIES: No known drug allergies.   MEDICATIONS: The patient does not take any medications.   FAMILY  HISTORY: Father died of unknown causes. Mother possibly had cancer. One sister possibly had lupus.   SOCIAL HISTORY: The patient was a heavy smoker, used to smoke 3 packs per day, currently smokes half pack per day. The patient was a heavy alcohol abuser, currently drinks 12 beers over a month. There is no history of drug abuse. The patient's son and granddaughter have been living with him since September 2012.  REVIEW OF SYSTEMS: The patient is unable to provide a review of systems because he was unable to speak and communicate. As per the patient's son, he has been weak, has been having difficulty talking, and has lost weight in the last year.   PHYSICAL EXAMINATION:   VITAL SIGNS: Temperature 97.5, heart rate 109, respiratory rate 20, blood pressure 158/83, pulse oximetry 98%.   GENERAL: The patient is a thin disheveled elderly Caucasian male sitting propped up in bed.   HEAD: Atraumatic, normocephalic.   EYES: There is pallor. No icterus or cyanosis. Pupils equal, round, and reactive to light and accommodation.    ENT: The patient's tongue is enlarged, rough/corrugated. The patient is unable to verbally communicate by speaking.   NECK: Supple. No masses. No JVD. No thyromegaly.   CHEST WALL: No tenderness to palpation. Not using accessory muscles of respiration. No intercostal muscle retractions. There is tenderness to palpation on the right side of the chest wall posteriorly.   LUNGS: Bilaterally clear to auscultation. No wheezing, rales, or rhonchi.   CARDIOVASCULAR: S1, S2 regular. No murmur, rubs,  or gallops.   ABDOMEN: Soft, nontender, nondistended. No guarding. No rigidity. Normal bowel sounds.   SKIN: The patient has ecchymosis. No rashes or lesions.   PERIPHERIES: No pedal edema. 1+ pedal pulses.   MUSCULOSKELETAL: No cyanosis. No clubbing.   NEUROLOGIC: The patient is awake and alert. He doesn't communicate much. Nods yes and no to some questions. Moves all four  extremities.   PSYCH: Has blank expression on his face.   LABORATORY, DIAGNOSTIC, AND RADIOLOGICAL DATA: CAT scan of the neck shows no fractures. Tongue and oropharyngeal soft tissue mass. CAT scan of the head shows no acute abnormalities. CAT scan of the abdomen and pelvis showed posterior right rib fractures with adjacent pleural thickening, pulmonary nodules. CK normal. Troponin normal. White count 13.4, hemoglobin 11.9, hematocrit 35.6, platelets 283, glucose 107, BUN 13, creatinine 0.91, sodium 138, potassium 4.3, chloride 96, CO2 29, calcium 8.8, bilirubin 0.9, alkaline phosphatase 108. Normal LFTs. Lipase 31.   ASSESSMENT AND PLAN: This is a 74 year old male with history of chronic obstructive pulmonary disease, bronchitis, history of alcohol abuse in the past, and ongoing smoking who presents with multiple falls today. 1. Tongue and oropharyngeal mass, pulmonary nodules, likely malignancy. As per the patient's son, he has been unable to talk for the past two months and has lost about 40 pounds in the past year. Will obtain a PET of the head and neck. Will get an ENT and Oncology consultation.  2. Multiple falls with weakness and weight loss. Will get a PT evaluation. 3. Right-sided rib fractures. Will start the patient on incentive spirometry. Will provide pain control and start empiric antibiotics.  4. Leukocytosis, likely reactive. Will monitor closely.  5. Anemia, likely of chronic disease. Will monitor closely.  6. Hyperglycemia, possibly reactive. The patient has no history of diabetes. 7. Smoking. The patient was counseled about smoking cessation for more than three minutes. Will provide with a nicotine patch while in the hospital.   Reviewed old medical records, discussed with the patient's son the plan of care and management.   TIME SPENT: 75 minutes.   ____________________________ Darrick Meigs, MD sp:drc D: 09/11/2011 20:36:56 ET T: 09/12/2011 06:33:18  ET JOB#: [id number]  cc: Darrick Meigs, MD, <Dictator> Darrick Meigs MD ELECTRONICALLY SIGNED 09/12/2011 21:41

## 2014-12-06 NOTE — Consult Note (Signed)
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 Consult was requested because of questions about capacity to make informed decisions.   HISTORY OF PRESENT ILLNESS: Information obtained from the chart and from talking with the patient. The patient presented to the hospital a few days ago with a history of progressive weight loss, unsteadiness, falling, and a growing mass in his throat. It has been determined that he has malignancy in his tongue and throat area. My understanding from the attending doctor on the floor is that the patient is not thought to be a candidate for effective surgery; however, it has been suggested that radiation and perhaps chemotherapy could be of benefit to shrinking the tumor. The team presented the patient with the proposal that he have an intravenous port and catheter placed as well as a feeding tube placed. The patient told them that he was not ready to make a decision at that point. There is concern about whether he is capable of making an appropriate informed decision. From what I am told the patient's son has told the team that he is a power of attorney and insisted that he can make decisions for patient. The team did not feel they had documentation to support this. I spoke to the patient today and asked him why he was in the hospital. He told me that it was because he had lumps all over his tongue. He also told me that he has lost a great deal of weight and been weak recently. I asked him whether they had told him what the lumps on his tongue were and he said that he did not know. When I told him it was cancer he said that now that I mentioned it he did recall being told that. I explained to him the nature of tumors and the inevitable of  growth and the potential outcomes including various ways it could be fatal. The patient listened attentively and asked reasonable questions. I asked him if he remembered the team asking him to sign for consent forms earlier today. He said that he did and he did not feel at that time that he could make a decision but had to think about it. I asked him when he needed to think about and he said he needed to consider what his options were and what the potential benefits were. I explained that the catheter would allow them to give medication including possibly chemotherapy without sticking needles into his arm and allow him to go home with access in place. I also told him that the feeding tube was so that he could continue to receive nutrition even if he was no longer able to swallow. I explained the team felt that these were necessary in order for them to proceed with appropriate treatment for his cancer. I explained that this potentially could extend his life or improve his quality of life for some time. The patient stated that he did not want to die, that he wanted to get the treatment that would provide the best quality of life and give him the longest reasonable life. I asked him whether at this point knowing what I had explained to him if he would be willing to have the procedures done. He said that he wanted to  have what ever done that would allow them to do the best treatment for his cancer. He said that he would be agreeable to getting the procedures done at this point.   PAST PSYCHIATRIC HISTORY: I see it documented that he has a history of alcohol abuse in the past. I do not know that it has ever been specifically treated. Apparently he had cut back on his own in recent years. Otherwise, no known psychiatric history.   SOCIAL HISTORY: The patient had previously lived alone but apparently recently has been living with one of his sons. He is divorced and has five adult children. He is not working.   REVIEW  OF SYSTEMS: Complains of being able to swallow only with difficulty, having discomfort in his tongue and mouth, having bumps in his mouth. He says that he has lost a great deal of weight and has been weak and sick. He denies being depressed, denies any suicidal ideation.   MENTAL STATUS EXAM: Disheveled, not very well groomed, very sick-looking man in a hospital bed. He was awake and interactive in an appropriate manner. Eye contact was good. Psychomotor activity was normal given how sick he is. He made a reasonable attempt to speak articulately despite his tumor. Affect appeared to be a little bit constricted but not bizarrely so. There were signs of some reasonable affect. Mood was stated as being okay. Thoughts appeared to be somewhat slow and simple but not delusional or grossly impaired. I did some very brief cognitive questions with him. The patient knew where he was and could describe why he was in the hospital. He knew the current month. He could not remember the year. He was able to tell me his address, able to tell me his birth date.   ASSESSMENT: This is a 74 year old gentleman with throat cancer. Proposal to do some procedures in preparation of cancer treatment. The patient was attentive and asked reasonable questions and gave every indication of understanding my description. He told me that now that he has thought about it, he would like to have whatever procedures done that could extend his life if possible. He said that he would agree to have the procedures done this point. I think that based on my examination he has a basic capacity to make informed decisions at this point. I think his consents should be respected. If he were to change his mind and withdrawal consent, it would of course to be important to talk with him again about why he is doing that and making a decision AGAINST MEDICAL ADVICE but at this point he is saying that he will consent.    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  SECONDARY DIAGNOSIS:  AXIS I: No further diagnosis.   AXIS II: No diagnosis.   AXIS III: Chronic obstructive pulmonary disease, throat cancer.  AXIS IV: Severe stress from illness.   AXIS V: Functioning at time of evaluation 30.  ____________________________ Brad AmelJohn T. Kirin Pastorino, MD jtc:rbg D: 09/14/2011 17:23:47 ET T: 09/15/2011 11:46:41 ET JOB#: [id number]  cc: Brad AmelJohn T. Charon Smedberg, MD, <Dictator> Brad AmelJOHN T Marvion Bastidas MD ELECTRONICALLY SIGNED 09/15/2011 15:32

## 2014-12-06 NOTE — Consult Note (Signed)
Brief Consult Note: Diagnosis: dementia, cancer.   Patient was seen by consultant.   Consult note dictated.   Recommend to proceed with surgery or procedure.   Discussed with Attending MD.   Comments: Psychiatry: Patient seen. 74 year old man with cancer in the tongue and mouth. Proposed treatment involves radiation and medication. Team proposed placing an intravenous catheter and feeding tube. Earlier today he had declined to sign consent. Patient has some memory impairment but was able to follow a conversation about cancer and the treatment. He said he would prefer to get whatever treatment might help him live longer. I explained the proceedures and their purpose. Patient says now that he has had time to consider them he would agree to treatment. He appears to have the basic capacity at this time to understand the treatment and make informed decisions about it.  Electronic Signatures: Audery Amellapacs, Lequisha Cammack T (MD)  (Signed 31-Jan-13 17:11)  Authored: Brief Consult Note   Last Updated: 31-Jan-13 17:11 by Audery Amellapacs, Josiah Wojtaszek T (MD)

## 2014-12-06 NOTE — Consult Note (Signed)
PATIENT NAME:  [name], [name] MR#:  [id number] DATE OF BIRTH:  1940/11/30  DATE OF CONSULTATION:  09/30/2011  REFERRING PHYSICIAN:  Dr. Margarita GrizzleWoodruff  CONSULTING PHYSICIAN:  Lurline DelShaukat Jazminn Pomales, MD  REASON FOR CONSULTATION: Questionable GI bleed.   HISTORY OF PRESENT ILLNESS: This is a 74 year old Caucasian male with history of chronic obstructive pulmonary disease, recent diagnosis of head and neck cancer with mets, hiatal hernia, and gastroesophageal reflux disease. The patient recently underwent a surgical gastrostomy tube placement by Dr. Michela PitcherEly about three weeks ago. The patient was sent from the nursing facility as some coffee-ground leakage was noted around the PEG tube. The patient has been here for more than 12 hours. Nurses have not noticed any discharge around the feeding tube. The patient himself is very lethargic and therefore no further history is available from the patient. No other problems were reported by the nursing staff.   PAST MEDICAL HISTORY:  1. History of metastatic head and neck cancer.  2. Chronic obstructive pulmonary disease. 3. Hiatal hernia.  4. Gastroesophageal reflux disease.   PAST SURGICAL HISTORY:  1. Appendectomy.  2. Tonsillectomy.  3. History of colonoscopy.  4. History of recent G-tube placement by Dr. Michela PitcherEly.   MEDICATIONS: Fentanyl patch and Percocet.   ALLERGIES: None.   SOCIAL HISTORY: He used to be a heavy smoker.   REVIEW OF SYSTEMS: Not available.    PHYSICAL EXAMINATION:  GENERAL: Very cachectic, lethargic male, does not appear to be in any acute distress, barely arousable.   VITAL SIGNS: Temperature 97.9, pulse 82, respirations 16, blood pressure 106/61.   LUNGS: Grossly clear to auscultation bilaterally.   CARDIOVASCULAR: Regular rate and rhythm.    ABDOMEN: Slightly distended abdomen. Gastrostomy tube appears to be in place. No discharge, leakage, etc. was noted. Feeding tube was flushed with 100 mL of sterile water without any problems.  On aspiration, gastric contents were aspirated, confirming good tube placement. Abdominal examination is otherwise unremarkable except for mild tenderness around the feeding tube insertion site. No other significant abnormalities were noted.   NEUROLOGIC: Difficult to perform because of the patient's lethargy and poor cooperation.  LABORATORY, DIAGNOSTIC, AND RADIOLOGICAL DATA: Electrolytes are fairly unremarkable as well as BUN and creatinine and liver enzymes. Hemoglobin is 8.7, was 8.6 yesterday. White cell count is 6.9, platelet count 351. INR is 1.1.   ASSESSMENT AND PLAN: The patient is with some reported coffee-ground leakage around the feeding tube. This can be due to local trauma from the feeding tube itself. There are no signs of significant active gastrointestinal bleeding. The patient has no melena or hematemesis, and his hemoglobin and hematocrit seem to be normal. No further discharge was noted around the feeding tube and the feeding tube appears to be in good position. I would continue proton pump inhibitor although it can be switched to per G tube instead of IV.  Would ask the dietitian to help us resume his tube feeding and if he tolerates his tube feeding without any signs of active GI bleed then he can probably be discharged home in the next 24 hours or so.   ____________________________ Lurline DelShaukat Antonyo Hinderer, MD si:bjt D: 09/30/2011 13:27:24 ET T: 09/30/2011 13:44:43 ET JOB#: [id number]  cc: Lurline DelShaukat [name], MD, <Dictator> Lurline DelSHAUKAT [name] MD ELECTRONICALLY SIGNED 10/03/2011 12:06

## 2014-12-06 NOTE — Consult Note (Signed)
Pt seen, he is in agreement for a feeding tube.  No abd scars I can see.  Abd flat, non tender.  chest clear ant  fields.  Pt with fractured ribs from CT scan.  Plan to do PEG with Dr. Lennon AlstromMartarre tomorrow.    Electronic Signatures: Scot JunElliott, Robert T (MD)  (Signed on 31-Jan-13 18:23)  Authored  Last Updated: 31-Jan-13 18:23 by Scot JunElliott, Robert T (MD)

## 2014-12-06 NOTE — Consult Note (Signed)
Comments   Raquel Rey, RN, and I met with pt's daughter, Clarise Cruz, as well as pt's sisters Butch Penny and Vaughan Basta. They understand that pt's prognosis is extremely poor and that he will not likely be able to tolerate chemo. They want pt to be as comfortable as possible at end of life. We discussed the options of return to SNF with hospice services vs transfer to the [facility]. They would like for pt to go to [facility]. They are going to speak with pt's sons Bo & BJ and his daughter Velna Hatchet. They seem to feel that all family will be in agreement.  Ward, RN, hospital liason for [facility], notified and will see pt.   Electronic Signatures: Haniyah Maciolek, Izora Gala (MD)  (Signed 19-Feb-13 12:53)  Authored: Palliative Care   Last Updated: 19-Feb-13 12:53 by Yazmen Briones, Izora Gala (MD)

## 2014-12-06 NOTE — H&P (Signed)
PATIENT NAME:  [name], [name] MR#:  [id number] DATE OF BIRTH:  Oct 23, 1940  DATE OF ADMISSION:  09/30/2011  PRIMARY CARE PHYSICIAN: None.  REFERRING PHYSICIAN: Dr. Margarita GrizzleWoodruff   CHIEF COMPLAINT: Coffee-ground leakage from PEG tube.   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 Patient is alert, awake, oriented but cannot communicate well due to head and neck cancer. According to his daughter, patient was noted to have coffee-ground leakage from PEG tube and then was sent to ED for further evaluation. In the ED patient was treated with Protonix drip. Surgical consult was requested by ED physician and it appears that Dr. Excell Seltzerooper evaluated patient and suggested no indication for surgery. Actually patient was admitted three weeks ago due to multiple falls and got PEG placement due to malnutrition and stage IV head and neck cancer. Patient is unable to communicate well but he denies any symptoms.   PAST MEDICAL HISTORY:  1. Head and neck cancer with metastasis. 2. Chronic obstructive pulmonary disease. 3. Hiatal hernia.  4. Gastroesophageal reflux disease.    PAST SURGICAL HISTORY:  1. Appendectomy.  2. Tonsillectomy.  3. Colonoscopy, which showed some polyps. 4. PEG placement three weeks ago.   MEDICATIONS:  1. Fentanyl 5 mcg/h transdermal one patch every 72 hours. 2. Percocet 5/325 mg p.o. every four hours.   ALLERGIES: No known drug allergies.   SOCIAL HISTORY: Patient was a heavy smoker. He used to smoke 3 packs per day but according to him currently he is smoking 1/2 to 1 pack a day. He denies any alcohol use or drug abuse.   REVIEW OF SYSTEMS: Patient is unable to provide review of systems because he was unable to speak and communicate.   PHYSICAL  EXAMINATION:  VITAL SIGNS: Temperature 97, pulse 77, respirations 18, blood pressure 110/56, oxygen saturation 95%. Pain scale 0.   GENERAL: Patient is alert, awake, oriented in no acute distress. Patient is very thin in malnutrition status.  HEENT: Pupils are round, not reactive to light, accommodation. Moist oral mucosa. Large tongue deformity and possible tongue cancer.    NECK: Supple. No JVD or carotid bruit. No lymphadenopathy. No thyromegaly.   CHEST WALL: No tenderness. Not using accessory muscle of respiration.   PULMONARY: Bilateral air entry. No wheezing or rales.   CARDIOVASCULAR: S1, S2 regular rate, rhythm. No murmurs, gallops.    ABDOMEN: Soft. No distention but has rigidity. PEG in place but there is some leakage from PEG site. The fluid is clear. No coffee ground fluid. There is diffuse tenderness. Difficult to estimate whether patient has organomegaly. Bowel sounds present.   EXTREMITIES: No edema, clubbing, or cyanosis. No calf tenderness. 1+ pedal pulses.   SKIN: No rash or jaundice.   NEUROLOGY: Alert and oriented x3. No focal deficit. Power 4/5 bilateral legs. Sensation intact.    LABORATORY, DIAGNOSTIC AND RADIOLOGICAL DATA: WBC 8.9. Hemoglobin 8.8, it was 8.4 two weeks ago. Platelets 394. INR 1.1, PT 15, glucose 104, BUN 22, creatinine 0.58, sodium 134, potassium 3.8, chloride 92.   IMPRESSION:  1. Possible GI bleeding.  2. PEG malfunction.  3. Head and neck cancer stage IV.  4. Malnutrition. 5. Anemia.  6. Chronic obstructive pulmonary disease.   PLAN OF TREATMENT:  1. Patient will be admitted to medical floor. Will keep n.p.o.,  start IV fluid D5 normal saline.  2. Will continue Protonix drip and follow up CBC q.8 hours.  3. GI consult from Dr. Niel Hummer.  4. Deep vein thrombosis prophylaxis with TED due to possible GI bleeding.  5. Continue fentanyl patch.   Discussed patient's situation and plan of treatment with patient's daughter.   TIME SPENT:  About 65 minutes.   ____________________________ Shaune Pollack, MD qc:cms D: 09/30/2011 01:23:53 ET T: 09/30/2011 08:56:08 ET JOB#: [id number]  cc: Shaune Pollack, MD, <Dictator> Shaune Pollack MD ELECTRONICALLY SIGNED 10/01/2011 0:15

## 2014-12-06 NOTE — Consult Note (Signed)
PATIENT NAME:  [name], [name] MR#:  [id number] DATE OF BIRTH:  01/20/1941  DATE OF CONSULTATION:  09/12/2011  REFERRING PHYSICIAN:  Darrick Meigs, MD CONSULTING PHYSICIAN:  Cammy Copa, MD  REASON FOR CONSULTATION: Tongue pain and swelling.   HISTORY OF PRESENT ILLNESS: The patient is a 74 year old male with a history of chronic obstructive pulmonary disease, hiatal hernia, and GE reflux disease who has had a two-month history of soreness on the left side of his tongue. It was felt that has been swelling. He is still able to drink liquids okay, but he is unable to speak because of the pain and unable to eat solid food. He has had a 40 pound weight loss over the last one year. He has smoked three packs a day for a while, but now is down to only about 1/2 pack a day. He has also been a heavy drinker in the past, but hardly is drinking anymore alcohol. He is weak and he was brought into the Emergency Room by his son because of falling several times in the last day or so because he is so weak.   PAST MEDICAL HISTORY:  1. Chronic obstructive pulmonary disease. 2. Hiatal hernia. 3. Gastroesophageal reflux disease.  4. Dilated loops of jejunum and abdominal pain.   DRUG ALLERGIES: No known drug allergies.  HOME MEDICATIONS: The patient was not on any medications at home.   FAMILY HISTORY: He thinks his mother had cancer and one sister possibly had lupus.   SOCIAL HISTORY: The patient has been a heavy smoker, up to 3 packs a day, but only smoking 1/2 pack a day currently. He has a history of heavy alcohol abuse and currently drinks 12 beers per month right now. No history of drug abuse. The patient has lived with his son and granddaughter for the last several months.   REVIEW OF SYSTEMS: The patient is unable to provide a review of systems because he is unable to speak and communicate well. He can try to talk, but it is very dysarthric and mostly just nods yes and no.   PHYSICAL EXAMINATION:  The patient is extremely thin. His nose is open and clear. The oropharynx shows a large growth on the left tongue base that is sticking out some and then there is ulceration along the entire left side of the tongue, left lateral border and even part of the dorsal border of the tongue extending all the way to the tip of the tongue. There is no bleeding noted here. It is extremely tender on the left side of his tongue where the mucosa is ulcerated. I cannot see the hypopharynx well. The jaw is very prominent as he has lost so much weight. There is no parotid swelling at all. You can feel a 1.5 to 2 cm node behind the submandibular gland along the jugular chain up underneath the left angle of the jaw. The rest of the neck appears of the clear.   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 He appears to have left neck node involvement as well at this time. He is scheduled for a PET scan later today. He needs histologic diagnosis of cancer and we will see if we can get an endoscopy and biopsy done in the next day or so. He is to see oncology today as well about possible treatment options and whether he is a candidate for chemotherapy/radiation. At this point, he is  not a candidate for extensive surgical treatment to help resolve this. He likely will need a feeding tube for nutrition as he cannot take enough orally and that will not likely improve in the near future even during treatment. ____________________________ Cammy CopaPaul H. Earlin Sweeden, MD phj:slb D: 09/12/2011 08:02:58 ET T: 09/12/2011 10:08:01 ET JOB#: [id number]  cc: Cammy CopaPaul H. Marzetta Lanza, MD, <Dictator> Cammy CopaPAUL H Conny Moening MD ELECTRONICALLY SIGNED 09/14/2011 8:28
